# Patient Record
Sex: Female | Born: 1980 | Race: Black or African American | Hispanic: No | Smoking: Never smoker
Health system: Southern US, Community
[De-identification: ages and names within clinical notes are randomized; demographics above are authoritative.]

## PROBLEM LIST (undated history)

## (undated) DIAGNOSIS — I10 Essential (primary) hypertension: Secondary | ICD-10-CM

## (undated) DIAGNOSIS — I509 Heart failure, unspecified: Secondary | ICD-10-CM

## (undated) DIAGNOSIS — I517 Cardiomegaly: Secondary | ICD-10-CM

## (undated) DIAGNOSIS — E282 Polycystic ovarian syndrome: Secondary | ICD-10-CM

## (undated) DIAGNOSIS — I729 Aneurysm of unspecified site: Secondary | ICD-10-CM

## (undated) HISTORY — PX: BRAIN SURGERY: SHX531

---

## 2020-08-15 ENCOUNTER — Emergency Department (HOSPITAL_COMMUNITY): Payer: Self-pay

## 2020-08-15 ENCOUNTER — Encounter (HOSPITAL_COMMUNITY): Payer: Self-pay

## 2020-08-15 ENCOUNTER — Emergency Department (HOSPITAL_COMMUNITY)
Admission: EM | Admit: 2020-08-15 | Discharge: 2020-08-16 | Disposition: A | Discharge status: Home / Self Care | Payer: Self-pay | Attending: Emergency Medicine | Admitting: Emergency Medicine

## 2020-08-15 ENCOUNTER — Encounter: Payer: Self-pay | Admitting: Emergency Medicine

## 2020-08-15 ENCOUNTER — Other Ambulatory Visit: Payer: Self-pay

## 2020-08-15 DIAGNOSIS — I509 Heart failure, unspecified: Secondary | ICD-10-CM | POA: Insufficient documentation

## 2020-08-15 DIAGNOSIS — I729 Aneurysm of unspecified site: Secondary | ICD-10-CM | POA: Insufficient documentation

## 2020-08-15 DIAGNOSIS — R0602 Shortness of breath: Secondary | ICD-10-CM

## 2020-08-15 DIAGNOSIS — E282 Polycystic ovarian syndrome: Secondary | ICD-10-CM | POA: Insufficient documentation

## 2020-08-15 DIAGNOSIS — D72829 Elevated white blood cell count, unspecified: Secondary | ICD-10-CM | POA: Insufficient documentation

## 2020-08-15 DIAGNOSIS — Z79899 Other long term (current) drug therapy: Secondary | ICD-10-CM | POA: Insufficient documentation

## 2020-08-15 DIAGNOSIS — I517 Cardiomegaly: Secondary | ICD-10-CM | POA: Insufficient documentation

## 2020-08-15 DIAGNOSIS — I110 Hypertensive heart disease with heart failure: Secondary | ICD-10-CM | POA: Insufficient documentation

## 2020-08-15 DIAGNOSIS — K921 Melena: Secondary | ICD-10-CM | POA: Insufficient documentation

## 2020-08-15 HISTORY — DX: Polycystic ovarian syndrome: E28.2

## 2020-08-15 HISTORY — DX: Cardiomegaly: I51.7

## 2020-08-15 HISTORY — DX: Aneurysm of unspecified site: I72.9

## 2020-08-15 HISTORY — DX: Heart failure, unspecified: I50.9

## 2020-08-15 HISTORY — DX: Essential (primary) hypertension: I10

## 2020-08-15 LAB — CBC WITH DIFFERENTIAL/PLATELET
Abs Immature Granulocytes: 0.04 10*3/uL (ref 0.00–0.07)
Basophils Absolute: 0.1 10*3/uL (ref 0.0–0.1)
Basophils Relative: 1 %
Eosinophils Absolute: 0.9 10*3/uL — ABNORMAL HIGH (ref 0.0–0.5)
Eosinophils Relative: 7 %
HCT: 37.7 % (ref 36.0–46.0)
Hemoglobin: 13.1 g/dL (ref 12.0–15.0)
Immature Granulocytes: 0 %
Lymphocytes Relative: 32 %
Lymphs Abs: 4.3 10*3/uL — ABNORMAL HIGH (ref 0.7–4.0)
MCH: 32 pg (ref 26.0–34.0)
MCHC: 34.7 g/dL (ref 30.0–36.0)
MCV: 92.2 fL (ref 80.0–100.0)
Monocytes Absolute: 0.7 10*3/uL (ref 0.1–1.0)
Monocytes Relative: 5 %
Neutro Abs: 7.4 10*3/uL (ref 1.7–7.7)
Neutrophils Relative %: 55 %
Platelets: 221 10*3/uL (ref 150–400)
RBC: 4.09 MIL/uL (ref 3.87–5.11)
RDW: 15.4 % (ref 11.5–15.5)
WBC: 13.4 10*3/uL — ABNORMAL HIGH (ref 4.0–10.5)
nRBC: 0 % (ref 0.0–0.2)

## 2020-08-15 LAB — COMPREHENSIVE METABOLIC PANEL
ALT: 23 U/L (ref 0–44)
AST: 27 U/L (ref 15–41)
Albumin: 4 g/dL (ref 3.5–5.0)
Alkaline Phosphatase: 68 U/L (ref 38–126)
Anion gap: 8 (ref 5–15)
BUN: 19 mg/dL (ref 6–20)
CO2: 27 mmol/L (ref 22–32)
Calcium: 9.3 mg/dL (ref 8.9–10.3)
Chloride: 106 mmol/L (ref 98–111)
Creatinine, Ser: 1.34 mg/dL — ABNORMAL HIGH (ref 0.44–1.00)
GFR, Estimated: 52 mL/min — ABNORMAL LOW (ref >60)
Glucose, Bld: 99 mg/dL (ref 70–99)
Potassium: 3.4 mmol/L — ABNORMAL LOW (ref 3.5–5.1)
Sodium: 141 mmol/L (ref 135–145)
Total Bilirubin: 0.8 mg/dL (ref 0.3–1.2)
Total Protein: 7.9 g/dL (ref 6.5–8.1)

## 2020-08-15 LAB — BRAIN NATRIURETIC PEPTIDE: B Natriuretic Peptide: 605.5 pg/mL — ABNORMAL HIGH (ref 0.0–100.0)

## 2020-08-15 LAB — TROPONIN I (HIGH SENSITIVITY): Troponin I (High Sensitivity): 6 ng/L (ref <18)

## 2020-08-15 MED ORDER — LISINOPRIL 40 MG PO TABS: 40.0000 mg | ORAL_TABLET | Freq: Every day | ORAL | 0 refills | Status: DC

## 2020-08-15 MED ORDER — AMLODIPINE BESYLATE 10 MG PO TABS: 10.0000 mg | ORAL_TABLET | Freq: Every day | ORAL | 0 refills | Status: DC

## 2020-08-15 MED ORDER — HYDRALAZINE HCL 25 MG PO TABS
50.0000 mg | ORAL_TABLET | Freq: Once | ORAL | Status: AC
  Administered 2020-08-15: 50 mg via ORAL
  Filled 2020-08-15: qty 2

## 2020-08-15 MED ORDER — FUROSEMIDE 10 MG/ML IJ SOLN
40.0000 mg | Freq: Once | INTRAMUSCULAR | Status: AC
  Administered 2020-08-15: 40 mg via INTRAVENOUS
  Filled 2020-08-15: qty 4

## 2020-08-15 MED ORDER — NITROGLYCERIN 2 % TD OINT
1.0000 [in_us] | TOPICAL_OINTMENT | Freq: Once | TRANSDERMAL | Status: AC
  Administered 2020-08-15: 1 [in_us] via TOPICAL
  Filled 2020-08-15: qty 30

## 2020-08-15 MED ORDER — FUROSEMIDE 20 MG PO TABS: 20.0000 mg | ORAL_TABLET | Freq: Every day | ORAL | 0 refills | Status: DC

## 2020-08-15 MED ORDER — AMLODIPINE BESYLATE 5 MG PO TABS
10.0000 mg | ORAL_TABLET | Freq: Once | ORAL | Status: AC
  Administered 2020-08-15: 10 mg via ORAL
  Filled 2020-08-15: qty 2

## 2020-08-15 MED ORDER — HYDRALAZINE HCL 20 MG/ML IJ SOLN
10.0000 mg | Freq: Once | INTRAMUSCULAR | Status: AC
  Administered 2020-08-15: 10 mg via INTRAVENOUS
  Filled 2020-08-15: qty 1

## 2020-08-15 MED ORDER — CLONIDINE HCL 0.1 MG PO TABS
0.2000 mg | ORAL_TABLET | Freq: Once | ORAL | Status: AC
  Administered 2020-08-15: 0.2 mg via ORAL
  Filled 2020-08-15: qty 2

## 2020-08-15 NOTE — ED Triage Notes (Signed)
Pt states that she hasn't taken her CHF medication in over a month due to her moving to [place]. She has been having shortness of breath today. Pt reports having bright red blood in her stool this morning. She also reports that she is unable to sleep due to her CHF.

## 2020-08-15 NOTE — ED Provider Notes (Signed)
Shortness ofEmergency Medicine Provider Triage Evaluation Note  Melanie Werner , a 40 y.o. female  was evaluated in triage.  Pt complains of high blood pressure breath and shortness of breath, he shortness of breath started today when she walked into this hospital.  She endorses that she went to urgent care earlier today and noted that she had extremely high blood pressure and sent here for reevaluation.  Patient states she has congestive heart failure and has not been taking any of her medications for over a months time, she denies chest pain, abdominal pain, headaches, change in vision, paresthesia weakness upper lower extremities.  She also notes that that she has anxiety and does not want to be here, she states she just wants her blood pressure medications refilled.  Review of Systems  Positive: High blood pressure, shortness of breath Negative: Change in vision, paresthesia weakness upper lower extremities.  Physical Exam  BP (!) 231/159 (BP Location: Left Arm)   Pulse (!) 101   Temp 98.3 F (36.8 C) (Oral)   Resp 18   Ht 5\' 6"  (1.676 m)   Wt 122.5 kg   SpO2 100%   BMI 43.58 kg/m  Gen:   Awake, no distress   Resp:  Normal effort  MSK:   Moves extremities without difficulty  Other:    Medical Decision Making  Medically screening exam initiated at 7:54 PM.  Appropriate orders placed.  Melanie Werner was informed that the remainder of the evaluation will be completed by another provider, this initial triage assessment does not replace that evaluation, and the importance of remaining in the ED until their evaluation is complete.  Patient presents with elevated blood pressure I am concerned for hypertensive emergency, I recommend making this patient a level 2, and she is roomed immediately as she will need further evaluation.  will start her on nitroglycerin, clonidine, hydralazine, amlodipine.  I advised her that she should not leave as her blood pressure is life-threatening.   , PA-C 08/15/20 1957    08/17/20 Melanie Duke, MD 08/15/20 [phone]

## 2020-08-15 NOTE — Discharge Instructions (Addendum)
We discussed the results of your lab work at length.  I provided medication to help with your blood pressure control.  In addition, we prescribed medication to help with fluid.  You will need to schedule an appointment with primary care physician in order to obtain this medication prescribed on a regular basis.

## 2020-08-15 NOTE — ED Provider Notes (Signed)
[facility]-EMERGENCY DEPT Provider Note   CSN: [id number] Arrival date & time: 08/15/20  1846     History Chief Complaint  Patient presents with   Congestive Heart Failure   Blood In Stools    Melanie Werner is a 40 y.o. female.  40 y.o female with a PMH of Aneurysm, CHF, HTN presents to the ED requesting refill of her medication for heart failure and hypertension.  Patient reports an ongoing history of high blood pressure, is currently on 2 regimens, she knows one of them is clonidine but unsure of the other 1.  Does report she is been out of her medication for the past 3 weeks, states that she is followed by primary care physician at [facility].  Reports has been increasing shortness of breath which she reports was exacerbated while stepping into the ED, as she states "I saw just a bunch of people that look sick ".  She was evaluated in urgent care earlier today, recommend that she be seen in the emergency department due to her condition.  For she has not been taking Lasix in the past 3 weeks, there has been some more swelling along with shortness of breath and orthopnea.  He denies any cough, fever, chest pain, headaches.  The history is provided by the patient and medical records.  Congestive Heart Failure This is a recurrent problem. The problem has been gradually worsening. Associated symptoms include shortness of breath. Pertinent negatives include no chest pain, no abdominal pain and no headaches. The symptoms are aggravated by walking. The symptoms are relieved by rest. She has tried nothing for the symptoms.      Past Medical History:  Diagnosis Date   Aneurysm (HCC)    Congestive heart failure (HCC)    Enlarged heart    Hypertension    Polycystic ovarian syndrome     Patient Active Problem List   Diagnosis Date Noted   Congestive heart failure (HCC) 08/15/2020   Enlarged heart 08/15/2020   Aneurysm (HCC) 08/15/2020   Polycystic ovarian syndrome  08/15/2020    Past Surgical History:  Procedure Laterality Date   BRAIN SURGERY       OB History   No obstetric history on file.     History reviewed. No pertinent family history.  Social History   Tobacco Use   Smoking status: Never   Smokeless tobacco: Never  Substance Use Topics   Alcohol use: Never   Drug use: Never    Home Medications Prior to Admission medications   Medication Sig Start Date End Date Taking? Authorizing Provider  amLODipine (NORVASC) 10 MG tablet Take 1 tablet (10 mg total) by mouth daily. 08/15/20 09/14/20 Yes Winona Sison, Leonie Douglas, PA-C  furosemide (LASIX) 20 MG tablet Take 1 tablet (20 mg total) by mouth daily for 3 days. 08/15/20 08/18/20 Yes Suhana Wilner, PA-C  lisinopril (ZESTRIL) 40 MG tablet Take 1 tablet (40 mg total) by mouth daily. 08/15/20 09/14/20 Yes Claude Manges, PA-C    Allergies    Patient has no allergy information on record.  Review of Systems   Review of Systems  Constitutional:  Negative for chills and fever.  Respiratory:  Positive for shortness of breath.   Cardiovascular:  Negative for chest pain.  Gastrointestinal:  Negative for abdominal pain.  Neurological:  Negative for headaches.  All other systems reviewed and are negative.  Physical Exam Updated Vital Signs BP (!) 212/130   Pulse 91   Temp 98.3 F (36.8 C) (Oral)  Resp (!) 21   Ht 5\' 6"  (1.676 m)   Wt 122.5 kg   SpO2 98%   BMI 43.58 kg/m   Physical Exam Vitals and nursing note reviewed.  Constitutional:      General: She is not in acute distress.    Appearance: Normal appearance. She is well-developed. She is obese. She is not ill-appearing.  HENT:     Head: Normocephalic and atraumatic.  Eyes:     Conjunctiva/sclera: Conjunctivae normal.  Cardiovascular:     Rate and Rhythm: Normal rate and regular rhythm.     Heart sounds: No murmur heard. Pulmonary:     Effort: Pulmonary effort is normal. No respiratory distress.     Breath sounds: Decreased breath  sounds present.     Comments: Difficult to auscultated due to body habitus.  Abdominal:     Palpations: Abdomen is soft.     Tenderness: There is no abdominal tenderness.  Musculoskeletal:     Cervical back: Neck supple.  Skin:    General: Skin is warm and dry.  Neurological:     Mental Status: She is alert and oriented to person, place, and time.    ED Results / Procedures / Treatments   Labs (all labs ordered are listed, but only abnormal results are displayed) Labs Reviewed  CBC WITH DIFFERENTIAL/PLATELET - Abnormal; Notable for the following components:      Result Value   WBC 13.4 (*)    Lymphs Abs 4.3 (*)    Eosinophils Absolute 0.9 (*)    All other components within normal limits  COMPREHENSIVE METABOLIC PANEL - Abnormal; Notable for the following components:   Potassium 3.4 (*)    Creatinine, Ser 1.34 (*)    GFR, Estimated 52 (*)    All other components within normal limits  BRAIN NATRIURETIC PEPTIDE - Abnormal; Notable for the following components:   B Natriuretic Peptide 605.5 (*)    All other components within normal limits  URINALYSIS, ROUTINE W REFLEX MICROSCOPIC  TROPONIN I (HIGH SENSITIVITY)  TROPONIN I (HIGH SENSITIVITY)    EKG None  Radiology DG Chest 2 View  Result Date: 08/15/2020 CLINICAL DATA:  40 year old female with shortness of breath. EXAM: CHEST - 2 VIEW COMPARISON:  None. FINDINGS: There is mild cardiomegaly with mild vascular congestion. Diffuse interstitial prominence and hazy airspace density throughout the lungs most consistent with edema. Pneumonia is not excluded clinical correlation recommended. There is no pleural effusion pneumothorax. No acute osseous pathology. IMPRESSION: Mild cardiomegaly with vascular congestion and edema. Pneumonia is not excluded. Electronically Signed   By: 24 M.D.   On: 08/15/2020 20:49    Procedures Procedures   Medications Ordered in ED Medications  nitroGLYCERIN (NITROGLYN) 2 % ointment 1  inch (1 inch Topical Given 08/15/20 2042)  cloNIDine (CATAPRES) tablet 0.2 mg (0.2 mg Oral Given 08/15/20 2005)  hydrALAZINE (APRESOLINE) tablet 50 mg (50 mg Oral Given 08/15/20 2005)  amLODipine (NORVASC) tablet 10 mg (10 mg Oral Given 08/15/20 2005)  hydrALAZINE (APRESOLINE) injection 10 mg (10 mg Intravenous Given 08/15/20 2228)  furosemide (LASIX) injection 40 mg (40 mg Intravenous Given 08/15/20 2228)    ED Course  I have reviewed the triage vital signs and the nursing notes.  Pertinent labs & imaging results that were available during my care of the patient were reviewed by me and considered in my medical decision making (see chart for details).  Clinical Course as of 08/16/20 0003  Tue Aug 15, 2020  2313 WBCJul 28, 2022):  13.4 [JS]  2313 B Natriuretic Peptide(!): 605.5 [JS]    Clinical Course User Index [JS] Claude Manges, PA-C   MDM Rules/Calculators/A&P   Patient with a PMH of CHF, HTN presents to the ED with chief complaint of shortness of breath along with requesting refill on her medication.  Patient reports she is not taking her blood pressure medication along with Lasix in the past 3 weeks.  She has noted worsening orthopnea, her blood pressure usually runs good at baseline with systolic in the 200s, diastolics in the 100's.   Interpretation of her labs showed a CBC with slight leukocytosis of 13.4, hemoglobin is stable.  CMP with slight hypokalemia.  Creatinine level slightly elevated, however I do not have a baseline to compare this to. LFTs are within normal limits.  First troponin is negative, she is denying any chest pain on today's visit.  Her BNP is 605, she will receive lasix while in the ED to help with her breathing along with blood pressure improvement.   Xray showed: Mild cardiomegaly with vascular congestion and edema. Pneumonia is  not excluded.   Patient has received multiple medication such as Lasix, hydralazine, nitro, Norvasc, clonidine, hydralazine to help with blood  pressure control.  12:02 AM patient reevaluated by me, remains without any headache, no chest pain, does report shortness of breath with exertion but this is at her baseline.  We did discuss appropriate follow-up with PCP.  She will be receiving prescription for blood pressure control, Lasix to help with fluid control.  He is otherwise asymptomatic from her hypertension, reports this levels are at her baseline.  Patient is agreeable to outpatient follow-up, return precautions discussed at length.  Patient stable for discharge   Portions of this note were generated with Dragon dictation software. Dictation errors may occur despite best attempts at proofreading.  Final Clinical Impression(s) / ED Diagnoses Final diagnoses:  Shortness of breath  Acute on chronic congestive heart failure, unspecified heart failure type [facility])    Rx / DC Orders ED Discharge Orders          Ordered    furosemide (LASIX) 20 MG tablet  Daily        08/15/20 2223    lisinopril (ZESTRIL) 40 MG tablet  Daily        08/15/20 2223    amLODipine (NORVASC) 10 MG tablet  Daily        08/15/20 2223             Claude Manges, PA-C 08/16/20 0003    Charlynne Pander, MD 08/16/20 [phone]

## 2020-08-15 NOTE — ED Notes (Signed)
Pt states that her baseline blood pressure is around 250/200

## 2020-08-16 LAB — URINALYSIS, ROUTINE W REFLEX MICROSCOPIC
Bilirubin Urine: NEGATIVE
Glucose, UA: NEGATIVE mg/dL
Hgb urine dipstick: NEGATIVE
Ketones, ur: NEGATIVE mg/dL
Nitrite: POSITIVE — AB
Protein, ur: 30 mg/dL — AB
Specific Gravity, Urine: 1.013 (ref 1.005–1.030)
pH: 6 (ref 5.0–8.0)

## 2021-04-17 ENCOUNTER — Ambulatory Visit: Payer: No Typology Code available for payment source | Admitting: Physician Assistant

## 2021-06-20 ENCOUNTER — Emergency Department (HOSPITAL_BASED_OUTPATIENT_CLINIC_OR_DEPARTMENT_OTHER): Payer: 59

## 2021-06-20 ENCOUNTER — Other Ambulatory Visit (HOSPITAL_BASED_OUTPATIENT_CLINIC_OR_DEPARTMENT_OTHER): Payer: Self-pay

## 2021-06-20 ENCOUNTER — Encounter (HOSPITAL_BASED_OUTPATIENT_CLINIC_OR_DEPARTMENT_OTHER): Payer: Self-pay

## 2021-06-20 ENCOUNTER — Emergency Department (HOSPITAL_BASED_OUTPATIENT_CLINIC_OR_DEPARTMENT_OTHER)
Admission: EM | Admit: 2021-06-20 | Discharge: 2021-06-20 | Discharge status: Against Medical Advice | Payer: 59 | Attending: Emergency Medicine | Admitting: Emergency Medicine

## 2021-06-20 ENCOUNTER — Other Ambulatory Visit: Payer: Self-pay

## 2021-06-20 DIAGNOSIS — R0602 Shortness of breath: Secondary | ICD-10-CM | POA: Diagnosis present

## 2021-06-20 DIAGNOSIS — I509 Heart failure, unspecified: Secondary | ICD-10-CM | POA: Diagnosis not present

## 2021-06-20 DIAGNOSIS — Z79899 Other long term (current) drug therapy: Secondary | ICD-10-CM | POA: Insufficient documentation

## 2021-06-20 DIAGNOSIS — I110 Hypertensive heart disease with heart failure: Secondary | ICD-10-CM | POA: Diagnosis not present

## 2021-06-20 LAB — COMPREHENSIVE METABOLIC PANEL
ALT: 33 U/L (ref 0–44)
AST: 32 U/L (ref 15–41)
Albumin: 4.2 g/dL (ref 3.5–5.0)
Alkaline Phosphatase: 67 U/L (ref 38–126)
Anion gap: 11 (ref 5–15)
BUN: 12 mg/dL (ref 6–20)
CO2: 23 mmol/L (ref 22–32)
Calcium: 9.1 mg/dL (ref 8.9–10.3)
Chloride: 104 mmol/L (ref 98–111)
Creatinine, Ser: 1.24 mg/dL — ABNORMAL HIGH (ref 0.44–1.00)
GFR, Estimated: 56 mL/min — ABNORMAL LOW (ref >60)
Glucose, Bld: 94 mg/dL (ref 70–99)
Potassium: 3.8 mmol/L (ref 3.5–5.1)
Sodium: 138 mmol/L (ref 135–145)
Total Bilirubin: 1 mg/dL (ref 0.3–1.2)
Total Protein: 8 g/dL (ref 6.5–8.1)

## 2021-06-20 LAB — CBC WITH DIFFERENTIAL/PLATELET
Abs Immature Granulocytes: 0.03 10*3/uL (ref 0.00–0.07)
Basophils Absolute: 0.1 10*3/uL (ref 0.0–0.1)
Basophils Relative: 1 %
Eosinophils Absolute: 0.6 10*3/uL — ABNORMAL HIGH (ref 0.0–0.5)
Eosinophils Relative: 5 %
HCT: 40.1 % (ref 36.0–46.0)
Hemoglobin: 13.9 g/dL (ref 12.0–15.0)
Immature Granulocytes: 0 %
Lymphocytes Relative: 32 %
Lymphs Abs: 3.5 10*3/uL (ref 0.7–4.0)
MCH: 30.6 pg (ref 26.0–34.0)
MCHC: 34.7 g/dL (ref 30.0–36.0)
MCV: 88.3 fL (ref 80.0–100.0)
Monocytes Absolute: 0.7 10*3/uL (ref 0.1–1.0)
Monocytes Relative: 6 %
Neutro Abs: 6.1 10*3/uL (ref 1.7–7.7)
Neutrophils Relative %: 56 %
Platelets: 167 10*3/uL (ref 150–400)
RBC: 4.54 MIL/uL (ref 3.87–5.11)
RDW: 14.8 % (ref 11.5–15.5)
WBC: 11 10*3/uL — ABNORMAL HIGH (ref 4.0–10.5)
nRBC: 0 % (ref 0.0–0.2)

## 2021-06-20 LAB — TROPONIN I (HIGH SENSITIVITY): Troponin I (High Sensitivity): 8 ng/L (ref <18)

## 2021-06-20 LAB — HCG, SERUM, QUALITATIVE: Preg, Serum: NEGATIVE

## 2021-06-20 MED ORDER — FUROSEMIDE 20 MG PO TABS
20.0000 mg | ORAL_TABLET | Freq: Every day | ORAL | 0 refills | Status: AC
  Filled 2021-06-20: qty 3, 3d supply, fill #0

## 2021-06-20 MED ORDER — LISINOPRIL 10 MG PO TABS
40.0000 mg | ORAL_TABLET | Freq: Once | ORAL | Status: AC
  Administered 2021-06-20: 40 mg via ORAL
  Filled 2021-06-20 (×2): qty 4

## 2021-06-20 MED ORDER — AMLODIPINE BESYLATE 10 MG PO TABS
10.0000 mg | ORAL_TABLET | Freq: Every day | ORAL | 0 refills | Status: AC
  Filled 2021-06-20: qty 30, 30d supply, fill #0

## 2021-06-20 MED ORDER — AMLODIPINE BESYLATE 5 MG PO TABS
10.0000 mg | ORAL_TABLET | Freq: Once | ORAL | Status: AC
  Administered 2021-06-20: 10 mg via ORAL
  Filled 2021-06-20: qty 2

## 2021-06-20 MED ORDER — FUROSEMIDE 20 MG PO TABS
20.0000 mg | ORAL_TABLET | Freq: Once | ORAL | Status: AC
  Administered 2021-06-20: 20 mg via ORAL
  Filled 2021-06-20: qty 1

## 2021-06-20 MED ORDER — LISINOPRIL 40 MG PO TABS
40.0000 mg | ORAL_TABLET | Freq: Every day | ORAL | 0 refills | Status: AC
  Filled 2021-06-20: qty 30, 30d supply, fill #0

## 2021-06-20 NOTE — ED Triage Notes (Signed)
Patient here POV from Home.  Endorses SOB that began yesterday but worsened today. Also endorses Pain in Right Lower Chest that began today.   No Fevers Recently. Endorses being without regular Medications for approximately 1-2 Weeks. History of CHF.   SOB in Triage. A&Ox4. GCS 15. BIB Wheelchair.

## 2021-06-20 NOTE — ED Notes (Signed)
Pt had all leads and pulse ox and BP cuff when I entered the room. Pt stated that she was ready to go and signed AMA. Pt given counsel and information given on prescriptions given. Pt A & O, ambulatory.

## 2021-06-20 NOTE — ED Notes (Addendum)
Pt refused to give cup of water used for medication. I explained to the pt needs to remain NPO except for sips with medication until all scans and blood work were completed. Then once everything is reviewed the provider would determine if she could have anything further. Pt stated that she was ok and she was not going to give the cup back. The Pt stated that she would not be admitted and that regardless she was going home today. Pt  was SOB when speaking. Pt showed 89% with a good pleth while I was speaking to her but then it went back up to 92-95%. I attempted to give further information on heart failure and her current condition. Pt stated that she just wanted her medication and to go home; that her vitals have been worse before.   MD notified of same

## 2021-06-20 NOTE — ED Notes (Signed)
RT notified of Pt

## 2021-06-20 NOTE — ED Provider Notes (Signed)
[facility] EMERGENCY DEPT Provider Note   CSN: [id number] Arrival date & time: 06/20/21  1129     History  Chief Complaint  Patient presents with   Shortness of Breath    Melanie Werner is a 41 y.o. female.   Shortness of Breath  Patient with medical history of CHF, hypertension presents today due to feeling short of breath because "I ran out of my medicine".  Patient states she ran out of her her medicine 2 weeks ago, started feeling short of breath last night.  She states she is having some right chest pain/right upper quadrant pain that started yesterday as well which is intermittent.  She denies any chest pain currently, denies any pleuritic relationship or positional changes.  Denies any association with laying flat or eating meals.  Gallbladder in place.  No history of PE or MI, does not smoke cigarettes but used to previously.  Denies any lower extremity swelling and sleeps on 1 pillow at night.  Home Medications Prior to Admission medications   Medication Sig Start Date End Date Taking? Authorizing Provider  amLODipine (NORVASC) 10 MG tablet Take 1 tablet (10 mg total) by mouth daily. 06/20/21 07/20/21  Theron Arista, PA-C  furosemide (LASIX) 20 MG tablet Take 1 tablet (20 mg total) by mouth daily for 3 days. 06/20/21 06/23/21  Theron Arista, PA-C  lisinopril (ZESTRIL) 40 MG tablet Take 1 tablet (40 mg total) by mouth daily. 06/20/21 07/20/21  Theron Arista, PA-C      Allergies    Patient has no allergy information on record.    Review of Systems   Review of Systems  Respiratory:  Positive for shortness of breath.    Physical Exam Updated Vital Signs BP (!) 162/132   Pulse 77   Temp 98.3 F (36.8 C) (Oral)   Resp (!) 32   Ht 5\' 6"  (1.676 m)   Wt 122.5 kg   SpO2 94%   BMI 43.59 kg/m  Physical Exam Vitals and nursing note reviewed. Exam conducted with a chaperone present.  Constitutional:      Appearance: Normal appearance.  HENT:     Head: Normocephalic  and atraumatic.  Eyes:     General: No scleral icterus.       Right eye: No discharge.        Left eye: No discharge.     Extraocular Movements: Extraocular movements intact.     Pupils: Pupils are equal, round, and reactive to light.  Cardiovascular:     Rate and Rhythm: Normal rate and regular rhythm.     Pulses: Normal pulses.     Heart sounds: Normal heart sounds. No murmur heard.   No friction rub. No gallop.  Pulmonary:     Effort: Pulmonary effort is normal. Tachypnea present. No respiratory distress.     Breath sounds: Normal breath sounds. No decreased breath sounds.     Comments: Mild tachypneic but no accessory muscle use and speaking complete sentences.  Lung sounds are diminished bilaterally to the mid level to the bases.  I do not appreciate any crackles or wheezing. Abdominal:     General: Abdomen is flat. Bowel sounds are normal. There is no distension.     Palpations: Abdomen is soft.     Tenderness: There is no abdominal tenderness.  Skin:    General: Skin is warm and dry.     Coloration: Skin is not jaundiced.  Neurological:     Mental Status: She is alert. Mental status  is at baseline.     Coordination: Coordination normal.    ED Results / Procedures / Treatments   Labs (all labs ordered are listed, but only abnormal results are displayed) Labs Reviewed  CBC WITH DIFFERENTIAL/PLATELET - Abnormal; Notable for the following components:      Result Value   WBC 11.0 (*)    Eosinophils Absolute 0.6 (*)    All other components within normal limits  COMPREHENSIVE METABOLIC PANEL - Abnormal; Notable for the following components:   Creatinine, Ser 1.24 (*)    GFR, Estimated 56 (*)    All other components within normal limits  HCG, SERUM, QUALITATIVE  TROPONIN I (HIGH SENSITIVITY)  TROPONIN I (HIGH SENSITIVITY)    EKG EKG Interpretation  Date/Time:  Wednesday Jun 20 2021 11:37:51 EDT Ventricular Rate:  79 PR Interval:  142 QRS Duration: 76 QT  Interval:  388 QTC Calculation: 444 R Axis:   61 Text Interpretation: Normal sinus rhythm with sinus arrhythmia Biatrial enlargement Septal infarct , age undetermined T wave abnormality, consider inferior ischemia Abnormal ECG When compared with ECG of 15-Aug-2020 20:00, PREVIOUS ECG IS PRESENT Confirmed by Norman Clay ([id number]) on 06/20/2021 1:17:14 PM  Radiology DG Chest Portable 1 View  Result Date: 06/20/2021 CLINICAL DATA:  Shortness of breath, history of CHF EXAM: PORTABLE CHEST 1 VIEW COMPARISON:  Chest radiograph 08/15/2020 FINDINGS: The heart is enlarged, similar to the prior study. The upper mediastinal contours are normal. There are diffusely increased interstitial markings likely reflecting pulmonary interstitial edema with prominence of the central pulmonary vasculature. There is no focal consolidation. There is no significant pleural effusion. There is no pneumothorax. There is no acute osseous abnormality. IMPRESSION: Cardiomegaly with vascular congestion and pulmonary interstitial edema. No focal consolidation or significant pleural effusion. Electronically Signed   By: Lesia Hausen M.D.   On: 06/20/2021 11:59    Procedures Procedures    Medications Ordered in ED Medications  lisinopril (ZESTRIL) tablet 40 mg (40 mg Oral Given 06/20/21 1228)  furosemide (LASIX) tablet 20 mg (20 mg Oral Given 06/20/21 1224)  amLODipine (NORVASC) tablet 10 mg (10 mg Oral Given 06/20/21 1224)    ED Course/ Medical Decision Making/ A&P Clinical Course as of 06/20/21 1338  Wed Jun 20, 2021  1202 DG Chest Portable 1 View [HS]    Clinical Course User Index [HS] Theron Arista, [place]                           Medical Decision Making Amount and/or Complexity of Data Reviewed Labs: ordered. Radiology: ordered. Decision-making details documented in ED Course.  Risk Prescription drug management.   This patient presents to the ED for concern of shortness of breath, this involves an extensive number of  treatment options, and is a complaint that carries with it a high risk of complications and morbidity.  The differential diagnosis includes CHF exacerbation, PE, pneumonia, ACS, pneumothorax.  Patient's presentation is complicated by their history of CHF, hypertension, medical noncompliance  Additional history obtained:   Independent historian: best fiend  Patient appears to be followed by Dr. Meryl Dare with cardiology at [facility].  Last appointment I can see was 04/16/2019, she did have an echo on 04/22/2019 which showed moderate left ventricular concentric hypertrophy, EF 60 to 65%, mild mitral regurg and mild tricuspid regurg.   Lab Tests:  I ordered, viewed, and personally interpreted labs.  The pertinent results include: Mild leukocytosis but no anemia.  Patient is not  pregnant, CMP shows slight elevated creatinine at 1.24 but no gross electrolyte derangement.  Troponin 8.    Imaging Studies ordered:  I directly visualized the chest x-ray which showed cardiomegaly and pulmonary congestion.,  I agree with the radiologist interpretation    ECG/Cardiac monitoring:   Per my interpretation, EKG shows sinus rhythm but T wave inversions in lateral and inferior leads.  This appears unchanged compared to previous EKG per chart review.  The patient was maintained on a cardiac monitor.  Visualized monitor strip which showed NSR per my interpretation.    Medicines ordered and prescription drug management:  I ordered medication including: Lisinopril, amlodipine, Lasix  I have reviewed the patients home medicines and have made adjustments as needed   Problems addressed / ED Course: Patient presents with shortness of breath and intermittent chest pain.  While in the cardiac monitoring she was noted to be slightly hypoxic at 89%.  She is been in the low to mid 90s during this ED stay and given she is having concurrent shortness of breath intermittent chest pain I think a PE study should be  obtained to evaluate for PE.  Discussed with patient, she has declined CTA PE study and states this is "just my CHF, I'll get better when I take my medicine".    We discussed the risks of patient leaving at this time AGAINST MEDICAL ADVICE, patient states she has a cardiology appointment tomorrow to get established in the area.  Unsure about what cardiologist name is.  We discussed risks of leaving against medical advising including but not limited to death.  Patient verbalized understanding of the risks.  I did refill patient's medications and she will refill them.   Social Determinants of Health: Does not see PCP regularly    Disposition:         Final Clinical Impression(s) / ED Diagnoses Final diagnoses:  Acute on chronic congestive heart failure, unspecified heart failure type (HCC)    Rx / DC Orders ED Discharge Orders          Ordered    amLODipine (NORVASC) 10 MG tablet  Daily        06/20/21 1315    furosemide (LASIX) 20 MG tablet  Daily        06/20/21 1315    lisinopril (ZESTRIL) 40 MG tablet  Daily        06/20/21 1315              Theron AristaSage, Deliana Avalos, PA-C 06/20/21 1338    Cheryll CockayneHong, Joshua S, MD 06/30/21 1658

## 2021-07-26 ENCOUNTER — Ambulatory Visit (HOSPITAL_BASED_OUTPATIENT_CLINIC_OR_DEPARTMENT_OTHER): Payer: Medicaid Other | Admitting: Nurse Practitioner

## 2022-12-18 IMAGING — CR DG CHEST 2V
2 series · 2 of 2 positions shown · non-contrast
Comparison: None.

CLINICAL DATA: 39-year-old female with shortness of breath.

EXAM:
CHEST - 2 VIEW

[w chest pa]
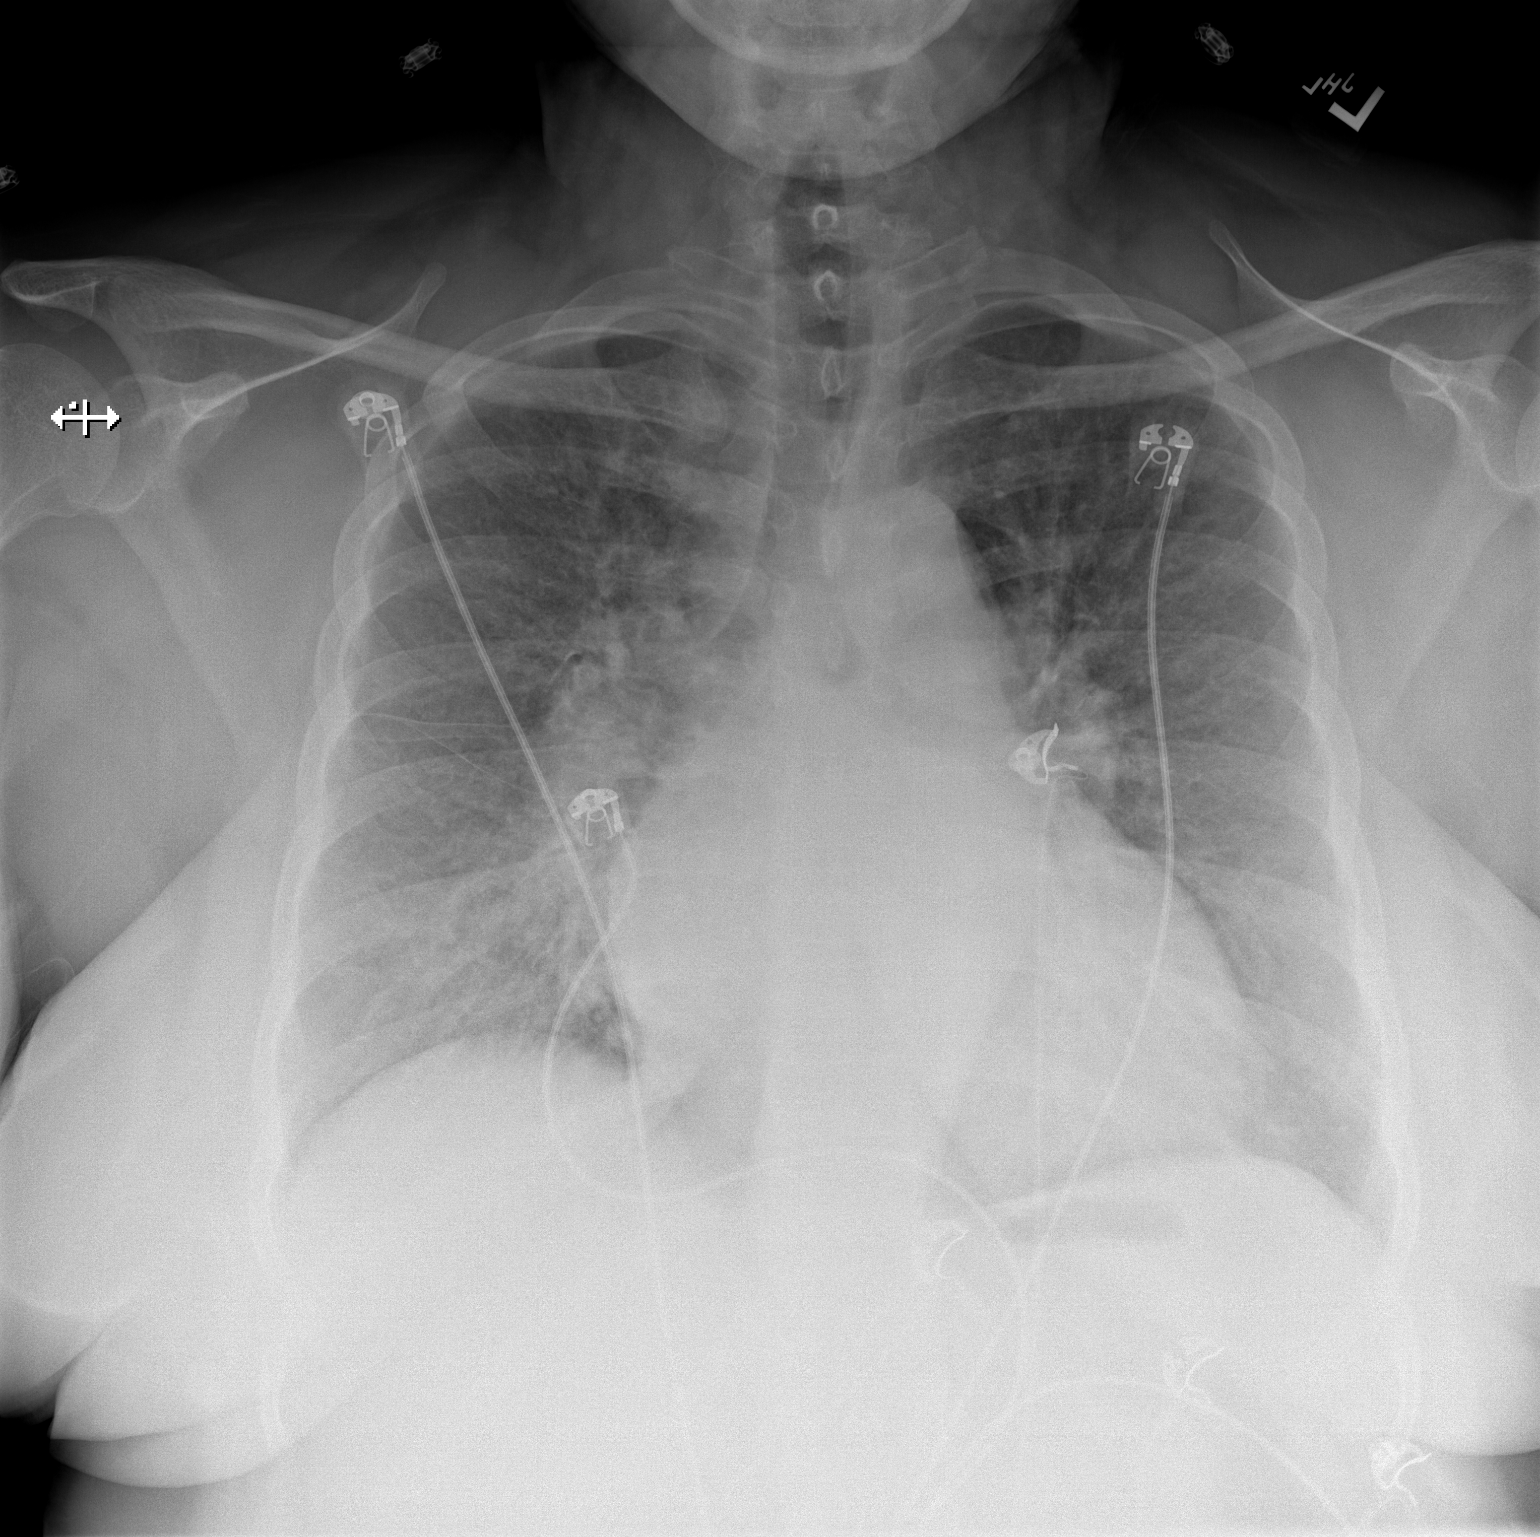

[w chest lat]
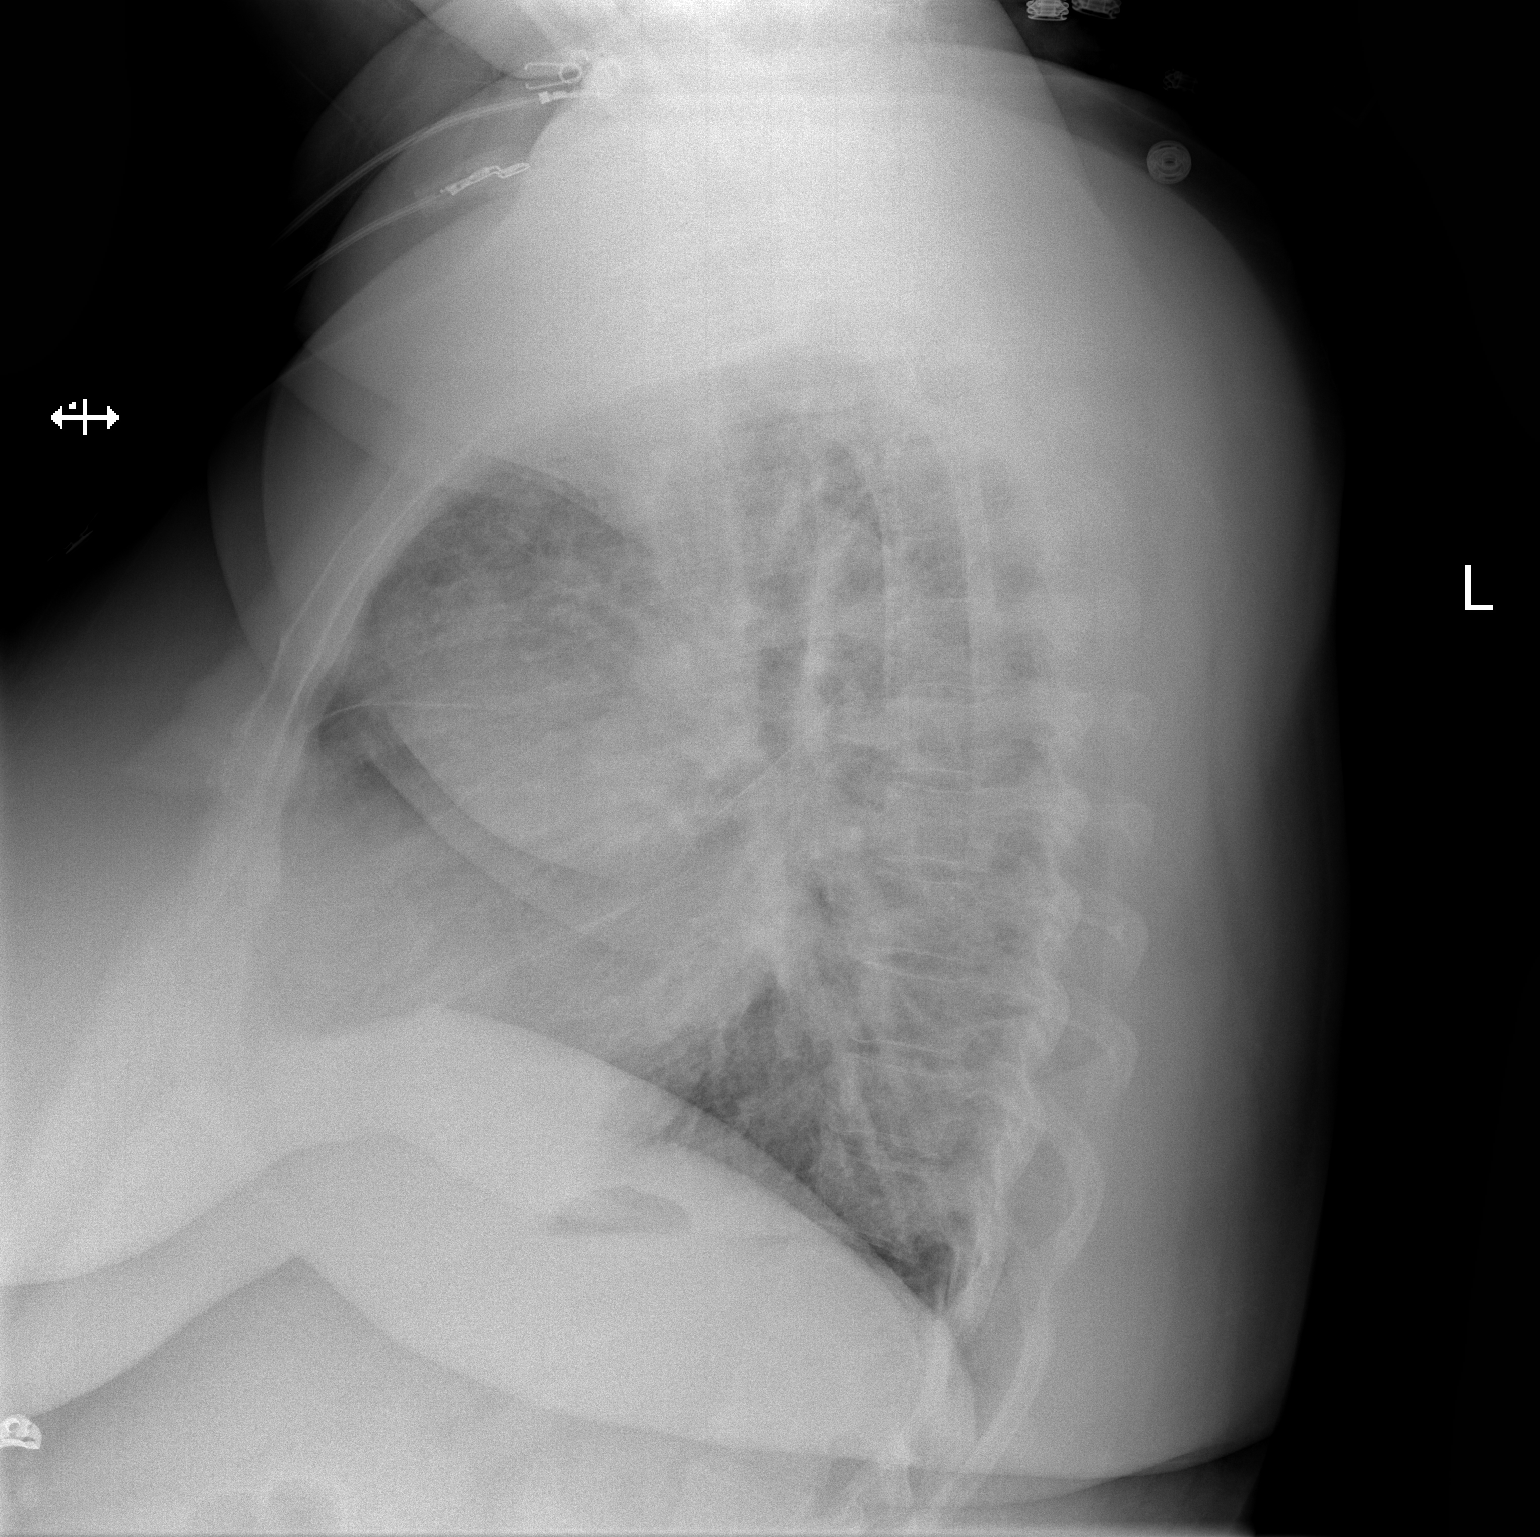

[2 of 2 positions shown; findings below may reference images not displayed]

FINDINGS: There is mild cardiomegaly with mild vascular congestion. Diffuse
interstitial prominence and hazy airspace density throughout the
lungs most consistent with edema. Pneumonia is not excluded clinical
correlation recommended. There is no pleural effusion pneumothorax.
No acute osseous pathology.
IMPRESSION: Mild cardiomegaly with vascular congestion and edema. Pneumonia is
not excluded.

## 2023-06-22 DEATH — deceased

## 2023-10-23 IMAGING — DX DG CHEST 1V PORT
1 series · 1 of 1 positions shown · non-contrast
Comparison: Chest radiograph 08/15/2020

CLINICAL DATA: Shortness of breath, history of CHF

EXAM:
PORTABLE CHEST 1 VIEW

[chest ap]
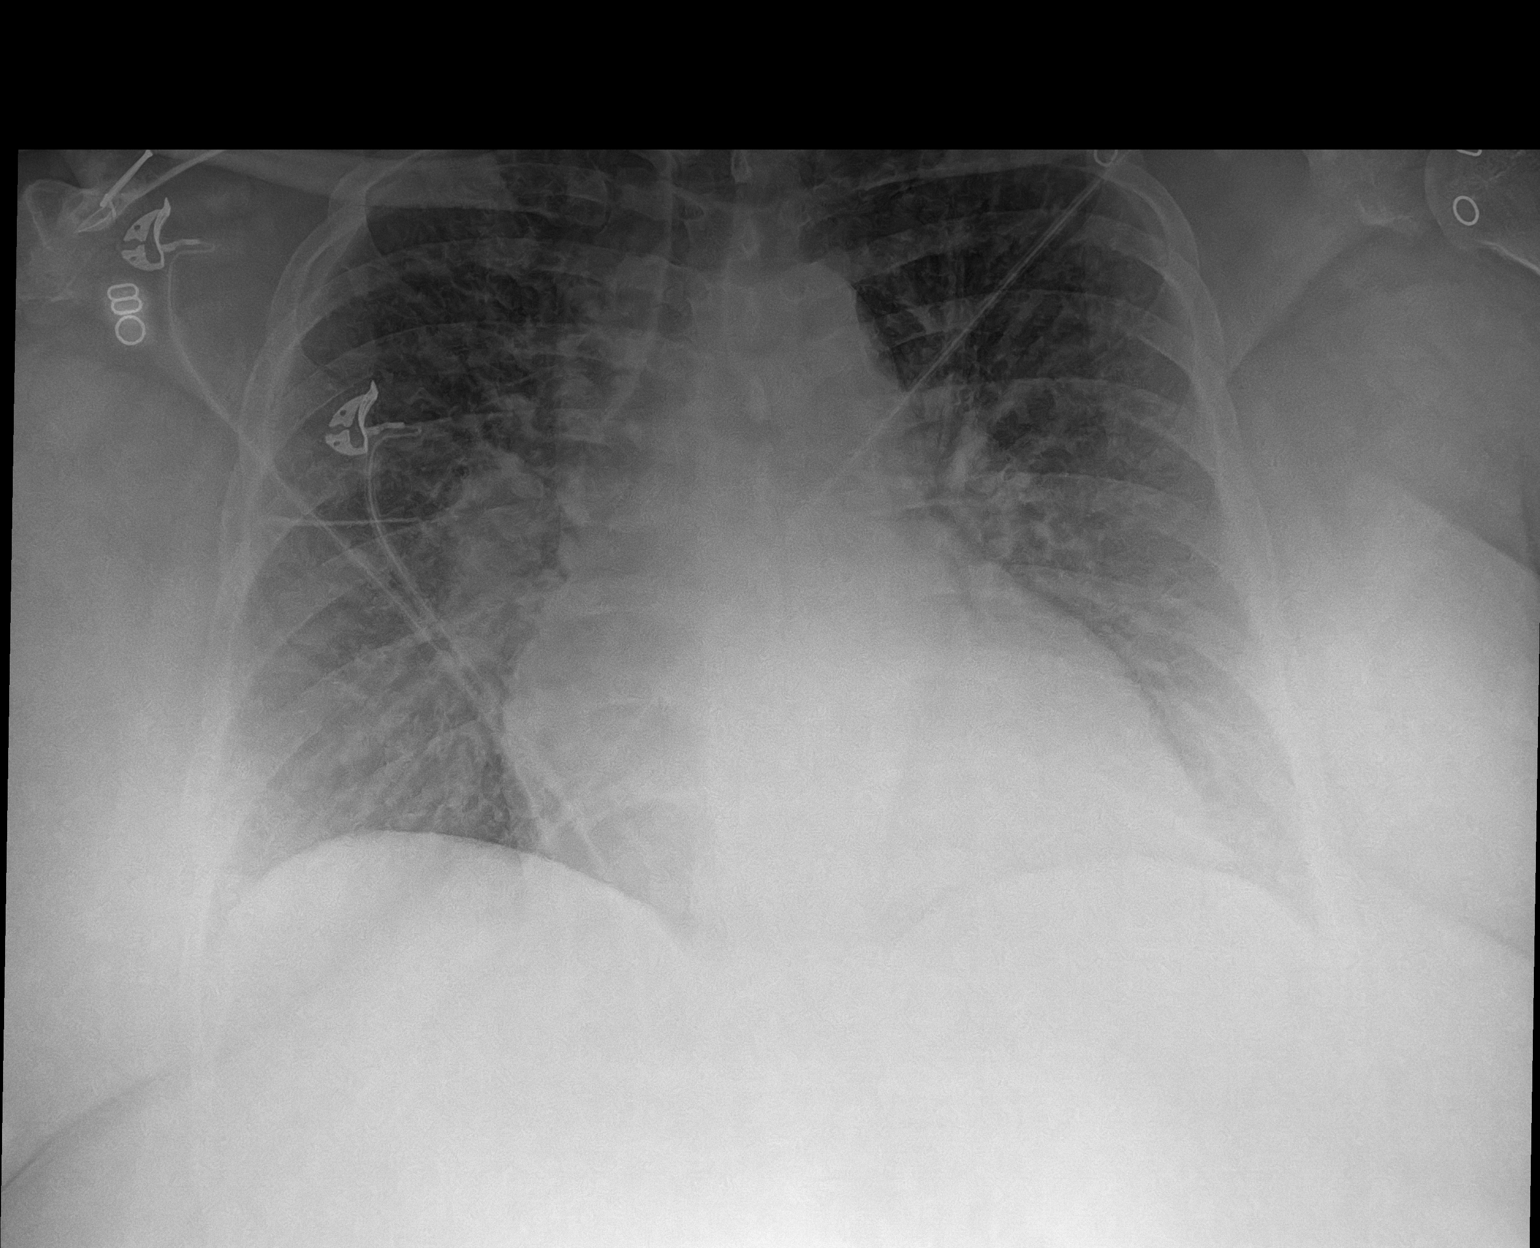

[1 of 1 positions shown; findings below may reference images not displayed]

FINDINGS: The heart is enlarged, similar to the prior study. The upper
mediastinal contours are normal.

There are diffusely increased interstitial markings likely
reflecting pulmonary interstitial edema with prominence of the
central pulmonary vasculature. There is no focal consolidation.
There is no significant pleural effusion. There is no pneumothorax.

There is no acute osseous abnormality.
IMPRESSION: Cardiomegaly with vascular congestion and pulmonary interstitial
edema. No focal consolidation or significant pleural effusion.
